# Patient Record
Sex: Male | Born: 1985
Health system: Southern US, Community
[De-identification: ages and names within clinical notes are randomized; demographics above are authoritative.]

## PROBLEM LIST (undated history)

## (undated) DIAGNOSIS — F329 Major depressive disorder, single episode, unspecified: Secondary | ICD-10-CM

## (undated) DIAGNOSIS — F909 Attention-deficit hyperactivity disorder, unspecified type: Secondary | ICD-10-CM

## (undated) DIAGNOSIS — F32A Depression, unspecified: Secondary | ICD-10-CM

## (undated) HISTORY — DX: Major depressive disorder, single episode, unspecified: F32.9

## (undated) HISTORY — DX: Depression, unspecified: F32.A

## (undated) HISTORY — DX: Attention-deficit hyperactivity disorder, unspecified type: F90.9

---

## 2016-02-08 DIAGNOSIS — F411 Generalized anxiety disorder: Secondary | ICD-10-CM | POA: Diagnosis not present

## 2016-02-08 DIAGNOSIS — F909 Attention-deficit hyperactivity disorder, unspecified type: Secondary | ICD-10-CM | POA: Diagnosis not present

## 2016-04-26 ENCOUNTER — Encounter: Payer: Self-pay | Admitting: Family Medicine

## 2016-04-26 ENCOUNTER — Ambulatory Visit (INDEPENDENT_AMBULATORY_CARE_PROVIDER_SITE_OTHER): Payer: BLUE CROSS/BLUE SHIELD | Admitting: Family Medicine

## 2016-04-26 VITALS — BP 116/80 | HR 70 | Temp 97.6°F | Ht 70.0 in | Wt 173.1 lb

## 2016-04-26 DIAGNOSIS — M25572 Pain in left ankle and joints of left foot: Secondary | ICD-10-CM

## 2016-04-26 DIAGNOSIS — Z0000 Encounter for general adult medical examination without abnormal findings: Secondary | ICD-10-CM | POA: Diagnosis not present

## 2016-04-26 LAB — BASIC METABOLIC PANEL
BUN: 14 mg/dL (ref 6–23)
CHLORIDE: 102 meq/L (ref 96–112)
CO2: 30 mEq/L (ref 19–32)
CREATININE: 1.17 mg/dL (ref 0.40–1.50)
Calcium: 10.2 mg/dL (ref 8.4–10.5)
GFR: 79.09 mL/min (ref >60.00)
Glucose, Bld: 98 mg/dL (ref 70–99)
Potassium: 4.6 mEq/L (ref 3.5–5.1)
Sodium: 137 mEq/L (ref 135–145)

## 2016-04-26 LAB — LIPID PANEL
CHOL/HDL RATIO: 5
CHOLESTEROL: 200 mg/dL (ref 0–200)
HDL: 38.5 mg/dL — ABNORMAL LOW (ref >39.00)
LDL Cholesterol: 141 mg/dL — ABNORMAL HIGH (ref 0–99)
NonHDL: 161.41
TRIGLYCERIDES: 104 mg/dL (ref 0.0–149.0)
VLDL: 20.8 mg/dL (ref 0.0–40.0)

## 2016-04-26 NOTE — Patient Instructions (Addendum)
A few things to remember from today's visit:   Routine physical examination - Plan: Lipid Panel, Basic Metabolic Panel  Ankle pain, left - Plan: Ambulatory referral to Sports Medicine    At least 150 minutes of moderate exercise per week, daily brisk walking for 15-30 min is a good exercise option. Healthy diet low in saturated (animal) fats and sweets and consisting of fresh fruits and vegetables, lean meats such as fish and white chicken and whole grains.  - Vaccines:  Tdap vaccine every 10 years.  Shingles vaccine recommended at age 28, could be given after 28 years of age but not sure about insurance coverage.  Pneumonia vaccines:  Prevnar 13 at 28 and Pneumovax at 28.   -Screening recommendations for low/normal risk males:  Screening for diabetes at age 28-45 and every 3 years.    Lipid screening at 28 and every 3 years.   Colonoscopy for colon cancer screening at age 28 and until age 47.  Prostate cancer screening: some controversy.    We have ordered labs or studies at this visit.  It can take up to 1-2 weeks for results and processing. IF results require follow up or explanation, we will call you with instructions. Clinically stable results will be released to your [facility]. If you have not heard from [place] or cannot find your results in [facility] in 2 weeks please contact our office at [phone].  If you are not yet signed up for [facility], please consider signing up       Please be sure medication list is accurate. If a new problem present, please set up appointment sooner than planned today.

## 2016-04-26 NOTE — Progress Notes (Signed)
HPI:  [name] is a 30 y.o.male here today to establish care with me and for his routine physical examination.  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  -Concerns and/or follow up today: Left ankle pain. He is also concerned about diabetes and hyperlipidemia, according to patient in the past his BS and cholesterol have been mildly elevated.  Last CPE 1-2 years ago.  He follows a healthy diet and does exercise regularly.   Chronic medical problems: ADHD, he follows with psychiatrist. He has been Dx with depression in the past, age 21, he denies any symptom.  Hx of STD's : NG in early 20's.    He lives with girlfriend and roommate.    - He denies high alcohol intake or tobacco use. + Smokes marijuana occasional.  Ankle: left ankle pain for a while but worse for the past 9 months. He does not remember a specific injury but he played football during college and states that he may have injured it a few times. Anterior aspect of ankle, occasional numbness dorsum and decreased ROM. He has not noted edema or erythema and no other joint pain.    Review of Systems  Constitutional: Negative for activity change, appetite change, fatigue, fever and unexpected weight change.  HENT: Negative for dental problem, nosebleeds, sore throat, trouble swallowing and voice change.   Eyes: Negative for redness and visual disturbance.  Respiratory: Negative for apnea, cough, shortness of breath and wheezing.   Cardiovascular: Negative for chest pain, palpitations and leg swelling.  Gastrointestinal: Negative for abdominal pain, blood in stool, nausea and vomiting.  Endocrine: Negative for polydipsia and polyuria.  Genitourinary: Negative for decreased urine volume, dysuria, genital sores, hematuria and testicular pain.  Musculoskeletal: Positive for arthralgias. Negative for back pain, joint swelling and myalgias.  Skin: Negative for color change and rash.  Neurological: Positive for  numbness. Negative for dizziness, seizures, syncope, weakness and headaches.  Hematological: Negative for adenopathy. Does not bruise/bleed easily.  Psychiatric/Behavioral: Negative for confusion, hallucinations and sleep disturbance. The patient is not nervous/anxious.      No current outpatient prescriptions on file prior to visit.   No current facility-administered medications on file prior to visit.      Past Medical History:  Diagnosis Date  . ADHD (attention deficit hyperactivity disorder)    Follows with psychiatrists  . Depression    at age 70.    Not on File  Family History  Problem Relation Age of Onset  . Diabetes Father     Social History   Social History  . Marital status: Single    Spouse name: N/A  . Number of children: N/A  . Years of education: N/A   Social History Main Topics  . Smoking status: Never Smoker  . Smokeless tobacco: Never Used  . Alcohol use Yes     Comment: occasional  . Drug use:     Types: Marijuana     Comment: occasional  . Sexual activity: Yes    Birth control/ protection: Condom   Other Topics Concern  . None   Social History Narrative  . None     Vitals:   04/26/16 0801  BP: 116/80  Pulse: 70  Temp: 97.6 F (36.4 C)   Body mass index is 24.84 kg/m.  O2 98% RA.  Wt Readings from Last 3 Encounters:  04/26/16 173 lb 2 oz (78.5 kg)     Physical Exam  Nursing note and vitals reviewed. Constitutional: He  is oriented to person, place, and time. He appears well-developed. No distress.  HENT:  Head: Atraumatic.  Mouth/Throat: Uvula is midline, oropharynx is clear and moist and mucous membranes are normal.  Eyes: Conjunctivae and EOM are normal. Pupils are equal, round, and reactive to light.  Neck: Normal range of motion. No thyromegaly present.  Cardiovascular: Normal rate and regular rhythm.   No murmur heard. Pulses:      Dorsalis pedis pulses are 2+ on the right side, and 2+ on the left side.        Posterior tibial pulses are 2+ on the right side, and 2+ on the left side.  Respiratory: Effort normal and breath sounds normal. No respiratory distress.  GI: Soft. He exhibits no mass. There is no tenderness. Hernia confirmed negative in the right inguinal area and confirmed negative in the left inguinal area.  Genitourinary: Penis normal. Right testis shows no tenderness. Left testis shows no tenderness. Mass: cystic.  Genitourinary Comments: Soft rounded nodular lesion above left testicle.  Musculoskeletal: He exhibits no edema or tenderness.       Left ankle: He exhibits normal range of motion and no swelling. No tenderness. Achilles tendon exhibits no pain.  No major deformities appreciated and no signs of synovitis.   Lymphadenopathy:    He has no cervical adenopathy.       Right: No inguinal and no supraclavicular adenopathy present.       Left: No inguinal and no supraclavicular adenopathy present.  Neurological: He is alert and oriented to person, place, and time. He has normal strength. No cranial nerve deficit or sensory deficit. Coordination and gait normal.  Reflex Scores:      Bicep reflexes are 2+ on the right side and 2+ on the left side.      Patellar reflexes are 2+ on the right side and 2+ on the left side. Skin: Skin is warm. No erythema.  Psychiatric: He has a normal mood and affect.     Lab Results  Component Value Date   CHOL 200 04/26/2016   HDL 38.50 (L) 04/26/2016   LDLCALC 141 (H) 04/26/2016   TRIG 104.0 04/26/2016   CHOLHDL 5 04/26/2016   Lab Results  Component Value Date   CREATININE 1.17 04/26/2016   BUN 14 04/26/2016   NA 137 04/26/2016   K 4.6 04/26/2016   CL 102 04/26/2016   CO2 30 04/26/2016      ASSESSMENT AND PLAN:   Discussed the following assessment and plan:  Diagnoses linked to this encounter. Problem List Items Addressed This Visit    None    Visit Diagnoses    Routine physical examination    -  Primary   Relevant Orders    Lipid Panel (Completed)   Basic Metabolic Panel (Completed)   Ankle pain, left       Relevant Orders   Ambulatory referral to Sports Medicine      Current preventive preventive guidelines discussed. Because reported history of elevated glucose and cholesterol, labs ordered today. STD prevention discussed. Discussed the importance of a healthy diet and regular exercise for prevention of some chronic medical conditions.  Next routine physical in 1-2 years.   -In regard to findings on left testicle, he is reporting history of "cyst", reporting negative workup, unchanged. Recommend monitoring periodically for changes.  -Left ankle pain seems to be chronic, ? tarsal tunnel syndrome.  Referral to sports medicine was placed.    Return in about 1 year (around 04/26/2017)  for CPE.    Maximina Pirozzi G. [place], MD  [facility]. [place] office.

## 2016-04-26 NOTE — Progress Notes (Signed)
Pre visit review using our clinic review tool, if applicable. No additional management support is needed unless otherwise documented below in the visit note. 

## 2016-05-07 ENCOUNTER — Ambulatory Visit: Payer: BLUE CROSS/BLUE SHIELD | Admitting: Family Medicine

## 2016-05-13 NOTE — Progress Notes (Signed)
Tawana ScaleZach Raylee Adamec D.O. [facility] [address] [place], [place] [address] Phone: [phone]) [phone] Subjective:    I'm seeing this patient by the request  of:  Betty [place]azil[place]Jordan, MD   CC: Left ankle pain  GNF:[id number]:Subjective  Michael Vance is a 30 y.o. male coming in with complaint of left ankle pain. Been going on for greater than 9 months. Does not remember any specific injury. Has had numerous injuries from playing football in high school and college.  Patient states though that unfortunately continues to have pain in his left ankle. Notices any centering decreased range of motion. Patient states that it seems to walk. Can't do anything more than some light exercises. Running give him trouble. Patient states that he discuss like he does not have any range of motion. Sometimes associated with swelling. Rates the severity of pain is 4 out of 10 it is more the lack of motion.    Past Medical History:  Diagnosis Date  . ADHD (attention deficit hyperactivity disorder)    Follows with psychiatrists  . Depression    at age 30.   No past surgical history on file. Social History   Social History  . Marital status: Single    Spouse name: N/A  . Number of children: N/A  . Years of education: N/A   Social History Main Topics  . Smoking status: Never Smoker  . Smokeless tobacco: Never Used  . Alcohol use Yes     Comment: occasional  . Drug use:     Types: Marijuana     Comment: occasional  . Sexual activity: Yes    Birth control/ protection: Condom   Other Topics Concern  . None   Social History Narrative  . None   Not on File Family History  Problem Relation Age of Onset  . Diabetes Father     Past medical history, social, surgical and family history all reviewed in electronic medical record.  No pertanent information unless stated regarding to the chief complaint.   Review of Systems: No headache, visual changes, nausea, vomiting, diarrhea, constipation, dizziness, abdominal  pain, skin rash, fevers, chills, night sweats, weight loss, swollen lymph nodes, body aches, joint swelling, muscle aches, chest pain, shortness of breath, mood changes.   Objective  Blood pressure 126/84, pulse 86, height 5\' 10"  (1.778 m), weight 175 lb (79.4 kg), SpO2 98 %.  General: No apparent distress alert and oriented x3 mood and affect normal, dressed appropriately.  HEENT: Pupils equal, extraocular movements intact  Respiratory: Patient's speak in full sentences and does not appear short of breath  Cardiovascular: No lower extremity edema, non tender, no erythema  Skin: Warm dry intact with no signs of infection or rash on extremities or on axial skeleton.  Abdomen: Soft nontender  Neuro: Cranial nerves II through XII are intact, neurovascularly intact in all extremities with 2+ DTRs and 2+ pulses.  Lymph: No lymphadenopathy of posterior or anterior cervical chain or axillae bilaterally.  Gait normal with good balance and coordination.  MSK:  Non tender with full range of motion and good stability and symmetric strength and tone of shoulders, elbows, wrist, hip, knees bilaterally.  Ankle: Left Trace swelling laterally. Range of motion is decreased Lykens the last 7 of dorsiflexion in the last 5 of plantarflexion. Strength is 5/5 in all directions. Stable lateral and medial ligaments; squeeze test and kleiger test unremarkable; Talar dome tender to palpation No pain at base of 5th MT; No tenderness over cuboid; No  tenderness over N spot or navicular prominence No tenderness on posterior aspects of lateral and medial malleolus No sign of peroneal tendon subluxations or tenderness to palpation Negative tarsal tunnel tinel's Able to walk 4 steps. Contralateral ankle unremarkable  MSK US performed of: Left ankle This study was ordered, performed, and interpreted by Terrilee FilesZach Breda Bond D.O.  Foot/Ankle:   All structures visualized.   Talar dome has hypoechoic changes and doesn't appear  with seems to be loose bodies Ankle mortise with mild to moderate effusion Peroneus longus and brevis tendons unremarkable on long and transverse views without sheath effusions. Posterior tibialis, flexor hallucis longus, and flexor digitorum longus tendons unremarkable on long and transverse views without sheath effusions. AAnterior Talofibular Ligament shows chronic tear.  Calcaneofibular Ligaments unremarkable and intact. Deltoid Ligament unremarkable and intact. Increasing Doppler flow noted over the talar dome  IMPRESSION:  Joint effusion with likely loose bodies.     Impression and Recommendations:     This case required medical decision making of moderate complexity.      Note: This dictation was prepared with Dragon dictation along with smaller phrase technology. Any transcriptional errors that result from this process are unintentional.

## 2016-05-14 ENCOUNTER — Ambulatory Visit (INDEPENDENT_AMBULATORY_CARE_PROVIDER_SITE_OTHER): Payer: BLUE CROSS/BLUE SHIELD | Admitting: Family Medicine

## 2016-05-14 ENCOUNTER — Encounter: Payer: Self-pay | Admitting: Family Medicine

## 2016-05-14 ENCOUNTER — Ambulatory Visit (INDEPENDENT_AMBULATORY_CARE_PROVIDER_SITE_OTHER)
Admission: RE | Admit: 2016-05-14 | Discharge: 2016-05-14 | Disposition: A | Discharge status: Home / Self Care | Payer: BLUE CROSS/BLUE SHIELD | Source: Ambulatory Visit | Attending: Family Medicine | Admitting: Family Medicine

## 2016-05-14 ENCOUNTER — Other Ambulatory Visit: Payer: Self-pay

## 2016-05-14 VITALS — BP 126/84 | HR 86 | Ht 70.0 in | Wt 175.0 lb

## 2016-05-14 DIAGNOSIS — M25572 Pain in left ankle and joints of left foot: Secondary | ICD-10-CM | POA: Diagnosis not present

## 2016-05-14 DIAGNOSIS — R29898 Other symptoms and signs involving the musculoskeletal system: Secondary | ICD-10-CM | POA: Diagnosis not present

## 2016-05-14 DIAGNOSIS — M25472 Effusion, left ankle: Secondary | ICD-10-CM

## 2016-05-14 DIAGNOSIS — M25673 Stiffness of unspecified ankle, not elsewhere classified: Secondary | ICD-10-CM | POA: Insufficient documentation

## 2016-05-14 MED ORDER — VITAMIN D (ERGOCALCIFEROL) 1.25 MG (50000 UNIT) PO CAPS: 50000.0000 [IU] | ORAL_CAPSULE | ORAL | 0 refills | Status: DC

## 2016-05-14 MED ORDER — DICLOFENAC SODIUM 2 % TD SOLN: 2.0000 "application " | Freq: Two times a day (BID) | TRANSDERMAL | 3 refills | Status: AC

## 2016-05-14 NOTE — Assessment & Plan Note (Signed)
Patient does have an effusion noted on ultrasound today. Seems to have more of a loose body. I'm concern for possible OCD. X-rays ordered today for further evaluation. We discussed icing regimen. Given once weekly vitamin D supplementation to see if this will help with any type of abnormal fracture that could not be healing. I do think that if patient continues to have limited range of motion that advance imaging would likely be warranted. Due to patient's age he may be a candidate for surgical intervention. Patient though will try conservative therapy, topical anti-inflammatories, and icing. Follow-up again in 3-4 weeks.

## 2016-05-14 NOTE — Patient Instructions (Signed)
Good to see you  I need an xray downstairs Ice bath 10 minutes at night Biking and elliptical is ok but wear the brace.  Wear brace with a lot of walking [employer] orthotics "total support" online would be great  pennsaid pinkie amount topically 2 times daily as needed.  Exercises 3 times a week.  Once weekly vitamin D for next 12 weeks See me again in 3-4 weeks and we will see how you are doing.

## 2016-05-15 ENCOUNTER — Telehealth: Payer: Self-pay | Admitting: Family Medicine

## 2016-05-15 DIAGNOSIS — M25572 Pain in left ankle and joints of left foot: Secondary | ICD-10-CM

## 2016-05-15 NOTE — Telephone Encounter (Signed)
Patient states to go ahead with MRI order.

## 2016-05-15 NOTE — Telephone Encounter (Signed)
Order entered. [facility] will contact pt to schedule appt.

## 2016-05-26 ENCOUNTER — Ambulatory Visit
Admission: RE | Admit: 2016-05-26 | Discharge: 2016-05-26 | Disposition: A | Discharge status: Home / Self Care | Payer: BLUE CROSS/BLUE SHIELD | Source: Ambulatory Visit | Attending: Family Medicine | Admitting: Family Medicine

## 2016-05-26 DIAGNOSIS — M25472 Effusion, left ankle: Secondary | ICD-10-CM | POA: Diagnosis not present

## 2016-05-26 DIAGNOSIS — M25572 Pain in left ankle and joints of left foot: Secondary | ICD-10-CM

## 2016-06-09 NOTE — Progress Notes (Signed)
Tawana ScaleZach Rahma Meller D.O. [facility] [address] [place], [place] [address] Phone: [phone] Subjective:    I'm seeing this patient by the request  of:  Betty [place], MD   CC: Left ankle pain f/u  [id number]:Subjective  Michael Vance is a 30 y.o. male coming in with complaint of left ankle pain. Been going on for greater than 9 months.  Patient's on ultrasound was found to have effusion with potential loose bodies. X-rays confirm this.  Patient was having some worsening symptoms and an MRI was scheduled. 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 Patient states though that he has been doing the icing and home exercises on a much more regular basis. Using the over-the-counter orthotics and has noticed some improvement. Patient states it does not feel as tight as usual. patient does not do any running he seems to be able to do all daily activities without any significant discomfort which is an improvement. Patient wanted no what other options were available to him and is here to discuss with the next.       Past Medical History:  Diagnosis Date  . ADHD (attention deficit hyperactivity disorder)    Follows with psychiatrists  . Depression    at age 89.   No past surgical history on file. Social History   Social History  . Marital status: Single    Spouse name: N/A  . Number of children: N/A  . Years of education: N/A   Social History Main Topics  . Smoking status: Never Smoker  . Smokeless tobacco: Never Used  . Alcohol use Yes     Comment: occasional  . Drug use:     Types: Marijuana     Comment: occasional  . Sexual activity: Yes    Birth control/ protection: Condom   Other Topics Concern  . Not on file   Social History Narrative  . No narrative on file   Not on File Family History  Problem Relation Age of Onset  . Diabetes Father     Past medical history, social, surgical and family history  all reviewed in electronic medical record.  No pertanent information unless stated regarding to the chief complaint.   Review of Systems: No headache, visual changes, nausea, vomiting, diarrhea, constipation, dizziness, abdominal pain, skin rash, fevers, chills, night sweats, weight loss, swollen lymph nodes, body aches, joint swelling, muscle aches, chest pain, shortness of breath, mood changes.   Objective  There were no vitals taken for this visit.  General: No apparent distress alert and oriented x3 mood and affect normal, dressed appropriately.  HEENT: Pupils equal, extraocular movements intact  Respiratory: Patient's speak in full sentences and does not appear short of breath  Cardiovascular: No lower extremity edema, non tender, no erythema  Skin: Warm dry intact with no signs of infection or rash on extremities or on axial skeleton.  Abdomen: Soft nontender  Neuro: Cranial nerves II through XII are intact, neurovascularly intact in all extremities with 2+ DTRs and 2+ pulses.  Lymph: No lymphadenopathy of posterior or anterior cervical chain or axillae bilaterally.  Gait normal with good balance and coordination.  MSK:  Non tender with full range of motion and good stability and symmetric strength and tone of shoulders, elbows, wrist, hip, knees bilaterally.  No swelling noted today of the left ankle. Continues to lack 5-10 of dorsiflexion and 5 of plantarflexion. Strength is 5/5 in all directions. Stable lateral and medial  ligaments; squeeze test and kleiger test unremarkable; Talar dome tender to palpation No pain at base of 5th MT; No tenderness over cuboid; No tenderness over N spot or navicular prominence No tenderness on posterior aspects of lateral and medial malleolus No sign of peroneal tendon subluxations or tenderness to palpation Negative tarsal tunnel tinel's Able to walk 4 steps. Contralateral ankle unremarkable Improvement from previous exam.    Impression and  Recommendations:     This case required medical decision making of moderate complexity.      Note: This dictation was prepared with Dragon dictation along with smaller phrase technology. Any transcriptional errors that result from this process are unintentional.

## 2016-06-10 ENCOUNTER — Encounter: Payer: Self-pay | Admitting: Family Medicine

## 2016-06-10 ENCOUNTER — Ambulatory Visit (INDEPENDENT_AMBULATORY_CARE_PROVIDER_SITE_OTHER): Payer: BLUE CROSS/BLUE SHIELD | Admitting: Family Medicine

## 2016-06-10 DIAGNOSIS — R29898 Other symptoms and signs involving the musculoskeletal system: Secondary | ICD-10-CM

## 2016-06-10 DIAGNOSIS — M25673 Stiffness of unspecified ankle, not elsewhere classified: Secondary | ICD-10-CM

## 2016-06-10 NOTE — Patient Instructions (Signed)
Good to see you   

## 2016-06-10 NOTE — Assessment & Plan Note (Signed)
Discussed with patient at great length. 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 Has been responding somewhat to conservative therapy. We discussed that with the pathology at this moment he would not be able to likely run on a regular basis at any point. Patient once again continue with the conservative therapy. We discussed continuing the icing, home exercises, topical anti-inflammatories. We discussed which activities to avoid. Patient also encouraged to get better shoes. Patient is doing well he can follow-up with me on an as-needed basis. Worsening symptoms we discussed the possibility of steroid injections as well as the potential surgical intervention and patient will be referred if he would like. Patient wants to hold at this time.  Spent  25 minutes with patient face-to-face and had greater than 50% of counseling including as described above in assessment and plan. This included different treatment options management and medications.

## 2016-06-11 DIAGNOSIS — F411 Generalized anxiety disorder: Secondary | ICD-10-CM | POA: Diagnosis not present

## 2016-06-11 DIAGNOSIS — F909 Attention-deficit hyperactivity disorder, unspecified type: Secondary | ICD-10-CM | POA: Diagnosis not present

## 2016-11-07 DIAGNOSIS — F411 Generalized anxiety disorder: Secondary | ICD-10-CM | POA: Diagnosis not present

## 2016-11-07 DIAGNOSIS — F909 Attention-deficit hyperactivity disorder, unspecified type: Secondary | ICD-10-CM | POA: Diagnosis not present

## 2017-03-05 DIAGNOSIS — F411 Generalized anxiety disorder: Secondary | ICD-10-CM | POA: Diagnosis not present

## 2017-03-05 DIAGNOSIS — F909 Attention-deficit hyperactivity disorder, unspecified type: Secondary | ICD-10-CM | POA: Diagnosis not present

## 2017-03-09 IMAGING — DX DG ANKLE COMPLETE 3+V*L*
3 series · 3 of 3 positions shown · non-contrast
Comparison: None.

CLINICAL DATA: One year of ankle pain with no known trauma.

EXAM:
LEFT ANKLE COMPLETE - 3+ VIEW

[ankle ap]
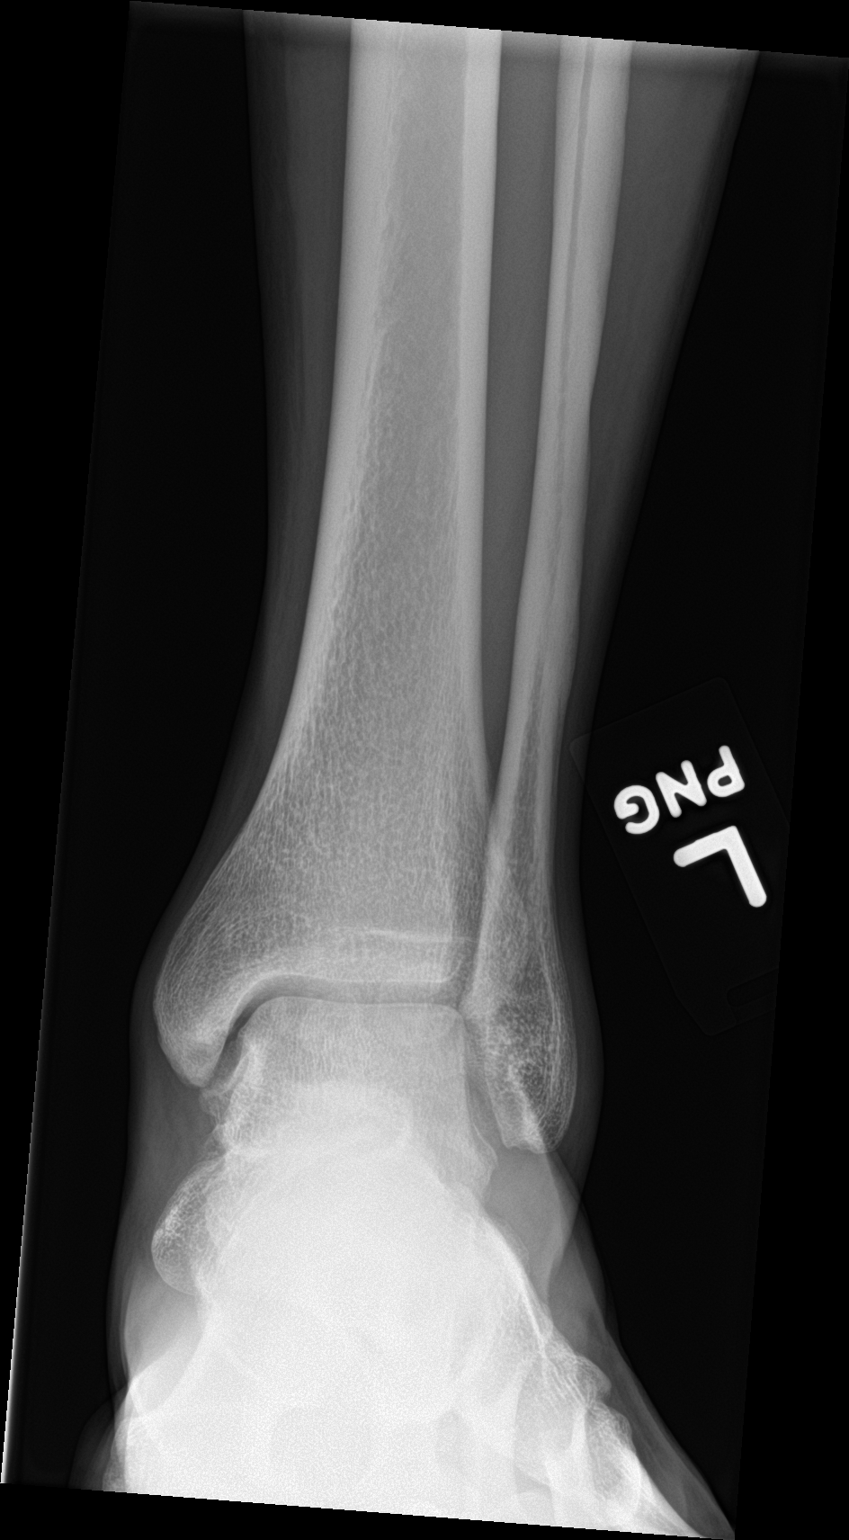

[ankle obl]
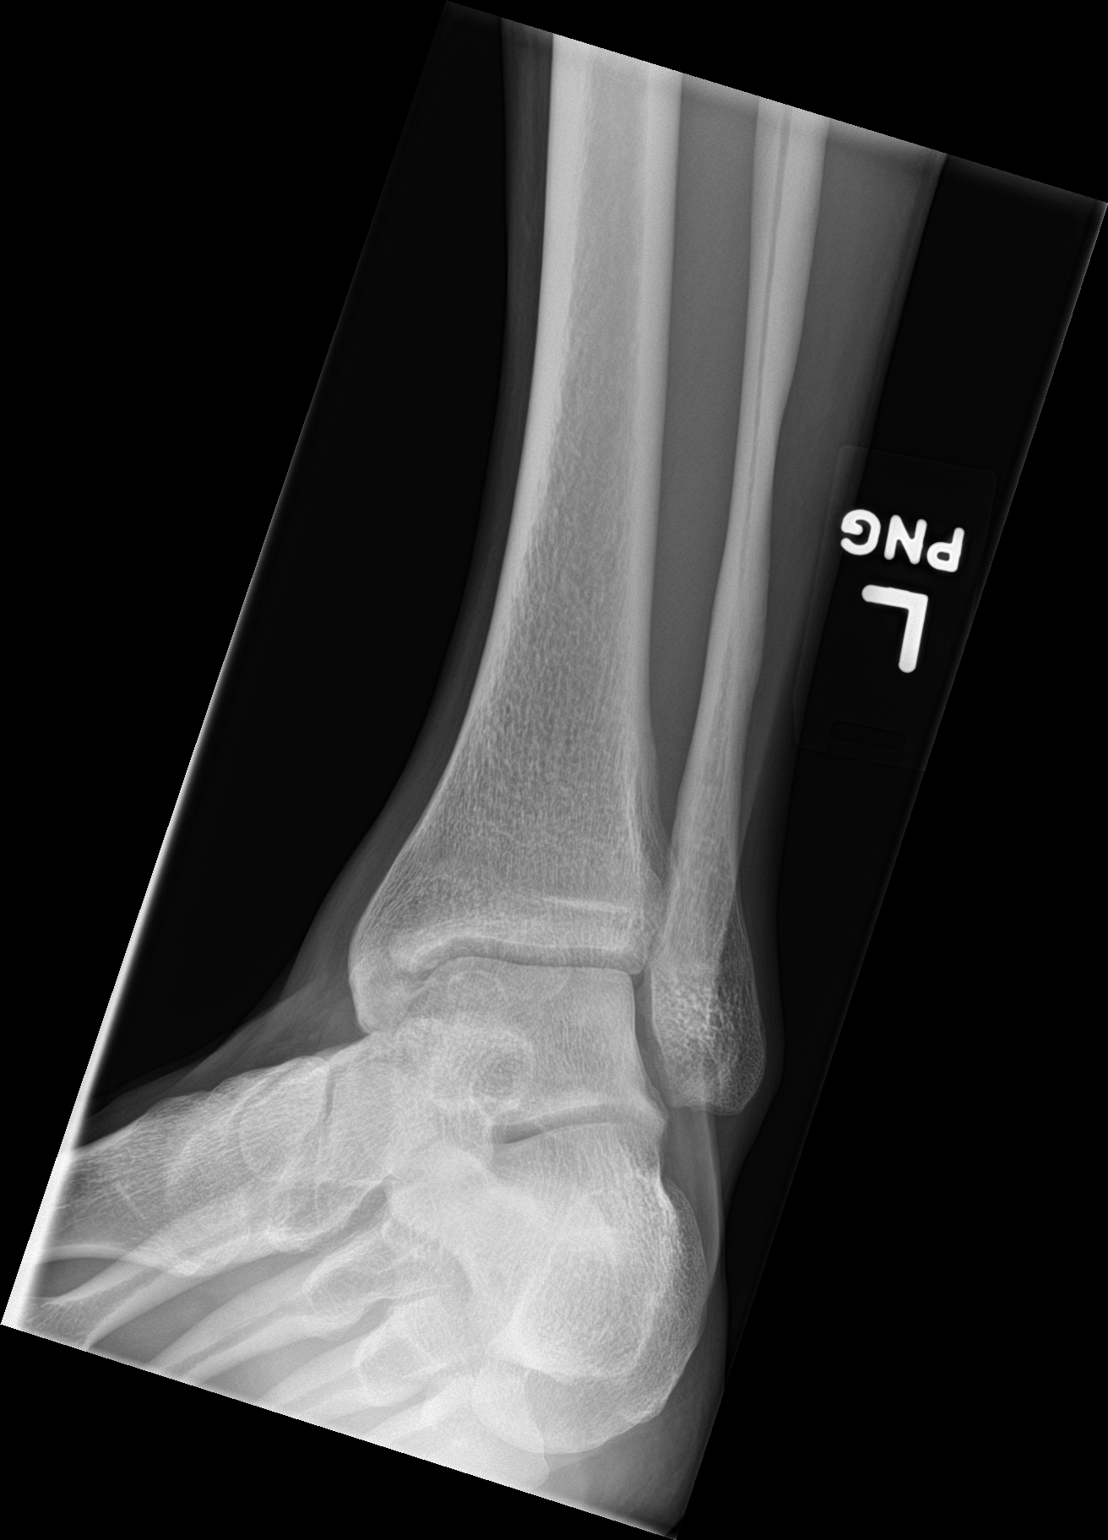

[ankle lat]
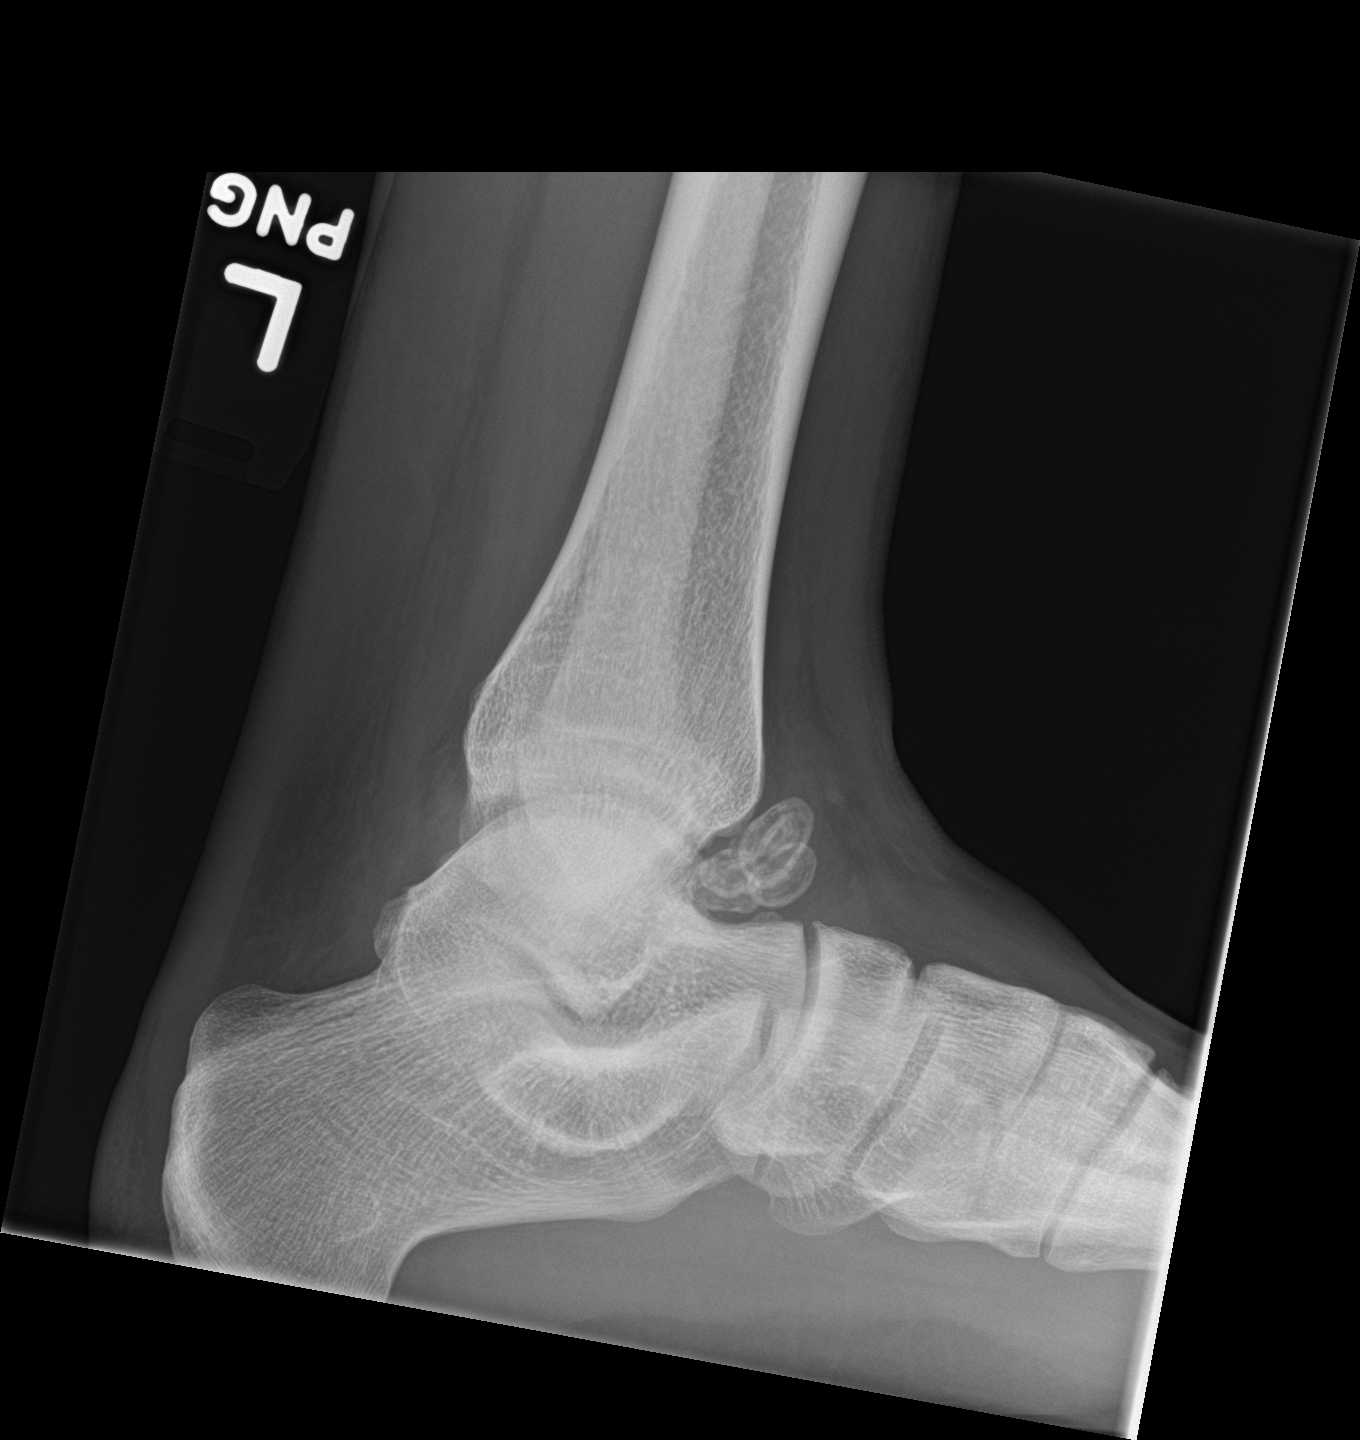

[3 of 3 positions shown; findings below may reference images not displayed]

FINDINGS: Laminated calcifications are seen anteriorly in the ankle joint on
the lateral view. No soft tissue swelling is identified. No acute
fractures are noted.
IMPRESSION: No acute fracture or dislocation. Laminated calcifications in the
anterior ankle joint, synovial osteochondromatosis versus loose
bodies.

## 2017-03-11 DIAGNOSIS — E785 Hyperlipidemia, unspecified: Secondary | ICD-10-CM | POA: Insufficient documentation

## 2017-03-11 NOTE — Progress Notes (Signed)
HPI:  [name] is a 31 y.o.male here today for his routine physical examination. He also needs biometric screening done, brought form today.   He lives with his fiance, planning on getting marry in a year.  Regular exercise 3 or more times per week: More consistent recently. Following a healthy diet: Not consistently.   Chronic medical problems: Chronic left ankle pain, follows with Dr Katrinka Blazing .  Vit D deficiency, he is not on vit D supplementation now. ADHD and past Hx of depression.He is currently following with psychiatrists, he follows every 3 months.  Dyslipidemia: He is on non pharmacologic treatment.  Lab Results  Component Value Date   CHOL 200 04/26/2016   HDL 38.50 (L) 04/26/2016   LDLCALC 141 (H) 04/26/2016   TRIG 104.0 04/26/2016   CHOLHDL 5 04/26/2016    Hx of STD's: GC in early 20's. He would like STD check, denies symptoms. According to pt, his fiance was recently Dx with UTI and she asked him to request urine test. He denies risk factors.  -Denies high alcohol intake, tobacco use, or Hx of illicit drug use.   Review of Systems  Constitutional: Negative for activity change, appetite change, fatigue, fever and unexpected weight change.  HENT: Negative for dental problem, nosebleeds, sore throat, trouble swallowing and voice change.   Eyes: Negative for redness and visual disturbance.  Respiratory: Negative for apnea, cough, shortness of breath and wheezing.   Cardiovascular: Negative for chest pain, palpitations and leg swelling.  Gastrointestinal: Negative for abdominal pain, blood in stool, nausea and vomiting.  Endocrine: Negative for cold intolerance, heat intolerance, polydipsia, polyphagia and polyuria.  Genitourinary: Negative for decreased urine volume, dysuria, genital sores, hematuria and testicular pain.  Musculoskeletal: Negative for gait problem and myalgias.  Skin: Negative for rash.  Neurological: Negative for dizziness,  syncope, weakness and headaches.  Hematological: Negative for adenopathy. Does not bruise/bleed easily.  Psychiatric/Behavioral: Negative for confusion and sleep disturbance. The patient is not nervous/anxious.   All other systems reviewed and are negative.    Current Outpatient Prescriptions on File Prior to Visit  Medication Sig Dispense Refill  . amphetamine-dextroamphetamine (ADDERALL) 30 MG tablet Take 30 mg by mouth daily.    . Diclofenac Sodium (PENNSAID) 2 % SOLN Place 2 application onto the skin 2 (two) times daily. 112 g 3   No current facility-administered medications on file prior to visit.      Past Medical History:  Diagnosis Date  . ADHD (attention deficit hyperactivity disorder)    Follows with psychiatrists  . Depression    at age 75.    Not on File  Family History  Problem Relation Age of Onset  . Diabetes Father     Social History   Social History  . Marital status: Single    Spouse name: N/A  . Number of children: N/A  . Years of education: N/A   Social History Main Topics  . Smoking status: Never Smoker  . Smokeless tobacco: Never Used  . Alcohol use Yes     Comment: occasional  . Drug use: Yes    Types: Marijuana     Comment: occasional  . Sexual activity: Yes    Birth control/ protection: Condom   Other Topics Concern  . None   Social History Narrative  . None     Vitals:   03/12/17 0757  BP: 118/80  Pulse: 80  Resp: 12   Body mass index is 26.02 kg/m.  O2 sat at RA 96%  Wt Readings from Last 3 Encounters:  03/12/17 181 lb 6 oz (82.3 kg)  06/10/16 179 lb (81.2 kg)  05/14/16 175 lb (79.4 kg)     Physical Exam  Nursing note and vitals reviewed. Constitutional: He is oriented to person, place, and time. He appears well-developed and well-nourished. No distress.  HENT:  Head: Atraumatic.  Right Ear: Tympanic membrane, external ear and ear canal normal.  Left Ear: Tympanic membrane, external ear and ear canal normal.    Mouth/Throat: Oropharynx is clear and moist and mucous membranes are normal.  Eyes: Conjunctivae and EOM are normal. Pupils are equal, round, and reactive to light.  Neck: Normal range of motion. No tracheal deviation present. No thyromegaly present.  Cardiovascular: Normal rate and regular rhythm.   No murmur heard. Pulses:      Dorsalis pedis pulses are 2+ on the right side, and 2+ on the left side.  Respiratory: Effort normal and breath sounds normal. No respiratory distress.  GI: Soft. He exhibits no mass. There is no tenderness.  Genitourinary: Testes normal and penis normal.  Genitourinary Comments: Urethral swab collected.  Musculoskeletal: He exhibits no edema or tenderness.  No major deformities appreciated and no signs of synovitis.  Lymphadenopathy:    He has no cervical adenopathy.       Right: No inguinal and no supraclavicular adenopathy present.       Left: No inguinal and no supraclavicular adenopathy present.  Neurological: He is alert and oriented to person, place, and time. He has normal strength. No cranial nerve deficit or sensory deficit. Coordination and gait normal.  Reflex Scores:      Bicep reflexes are 2+ on the right side and 2+ on the left side.      Patellar reflexes are 2+ on the right side and 2+ on the left side. Skin: Skin is warm. No rash noted. No erythema.  Psychiatric: He has a normal mood and affect. Cognition and memory are normal.  Well groomed,good eye contact.     ASSESSMENT AND PLAN:   Harrold Donathathan was seen today for annual exam.  Diagnoses and all orders for this visit:  Lab Results  Component Value Date   CHOL 224 (H) 03/12/2017   HDL 34.00 (L) 03/12/2017   LDLCALC 167 (H) 03/12/2017   TRIG 115.0 03/12/2017   CHOLHDL 7 03/12/2017   Lab Results  Component Value Date   CREATININE 1.12 03/12/2017   BUN 17 03/12/2017   NA 137 03/12/2017   K 4.2 03/12/2017   CL 102 03/12/2017   CO2 29 03/12/2017    Routine general medical  examination at a health care facility   We discussed the importance of regular physical activity and healthy diet for prevention of chronic illness and/or complications. Preventive guidelines reviewed. Vaccination updated. STD prevention discussed. Next CPE in 1-2 years.  -     Lipid panel -     HIV antibody -     Basic metabolic panel  Dyslipidemia (high LDL; low HDL)  Low fat diet recommended. Continue regular exercise. Further recommendations will be given according to lab results.  -     Lipid panel  Diabetes mellitus screening -     Basic metabolic panel  Screen for STD (sexually transmitted disease) -     Chlamydia/Gonococcus/Trichomonas, NAA  Need for Tdap vaccination -     Tdap vaccine greater than or equal to 7yo IM    Return in 1 year (on 03/12/2018)  for routine.    Betty G. [place], MD  [facility]. [place] [facility].

## 2017-03-12 ENCOUNTER — Encounter: Payer: Self-pay | Admitting: Family Medicine

## 2017-03-12 ENCOUNTER — Ambulatory Visit (INDEPENDENT_AMBULATORY_CARE_PROVIDER_SITE_OTHER): Payer: BLUE CROSS/BLUE SHIELD | Admitting: Family Medicine

## 2017-03-12 VITALS — BP 118/80 | HR 80 | Resp 12 | Ht 70.0 in | Wt 181.4 lb

## 2017-03-12 DIAGNOSIS — Z23 Encounter for immunization: Secondary | ICD-10-CM | POA: Diagnosis not present

## 2017-03-12 DIAGNOSIS — E785 Hyperlipidemia, unspecified: Secondary | ICD-10-CM

## 2017-03-12 DIAGNOSIS — Z0000 Encounter for general adult medical examination without abnormal findings: Secondary | ICD-10-CM | POA: Diagnosis not present

## 2017-03-12 DIAGNOSIS — Z131 Encounter for screening for diabetes mellitus: Secondary | ICD-10-CM

## 2017-03-12 DIAGNOSIS — Z113 Encounter for screening for infections with a predominantly sexual mode of transmission: Secondary | ICD-10-CM | POA: Diagnosis not present

## 2017-03-12 LAB — BASIC METABOLIC PANEL
BUN: 17 mg/dL (ref 6–23)
CALCIUM: 9.8 mg/dL (ref 8.4–10.5)
CHLORIDE: 102 meq/L (ref 96–112)
CO2: 29 mEq/L (ref 19–32)
CREATININE: 1.12 mg/dL (ref 0.40–1.50)
GFR: 82.65 mL/min (ref >60.00)
Glucose, Bld: 91 mg/dL (ref 70–99)
Potassium: 4.2 mEq/L (ref 3.5–5.1)
Sodium: 137 mEq/L (ref 135–145)

## 2017-03-12 LAB — LIPID PANEL
CHOL/HDL RATIO: 7
Cholesterol: 224 mg/dL — ABNORMAL HIGH (ref 0–200)
HDL: 34 mg/dL — AB (ref >39.00)
LDL Cholesterol: 167 mg/dL — ABNORMAL HIGH (ref 0–99)
NONHDL: 189.96
Triglycerides: 115 mg/dL (ref 0.0–149.0)
VLDL: 23 mg/dL (ref 0.0–40.0)

## 2017-03-12 NOTE — Patient Instructions (Signed)
A few things to remember from today's visit:   Routine general medical examination at a health care facility - Plan: Lipid panel, HIV antibody, Basic metabolic panel  Dyslipidemia (high LDL; low HDL) - Plan: Lipid panel  Diabetes mellitus screening - Plan: Basic metabolic panel  Screen for STD (sexually transmitted disease) - Plan: Chlamydia/Gonococcus/Trichomonas, NAA    At least 150 minutes of moderate exercise per week, daily brisk walking for 15-30 min is a good exercise option. Healthy diet low in saturated (animal) fats and sweets and consisting of fresh fruits and vegetables, lean meats such as fish and white chicken and whole grains.  - Vaccines:  Tdap vaccine every 10 years. Given today.  Shingles vaccine recommended at age 20, could be given after 29 years of age but not sure about insurance coverage.  Pneumonia vaccines:  Prevnar 13 at 65 and Pneumovax at 28.   -Screening recommendations for low/normal risk males:  Screening for diabetes at age 67-45 and every 3 years.    Lipid screening at 35 and every 3 years but in your case we will continue annually.   Colonoscopy for colon cancer screening at age 41 and until age 53.  Prostate cancer screening: some controversy.   Fat and Cholesterol Restricted Diet High levels of fat and cholesterol in your blood may lead to various health problems, such as diseases of the heart, blood vessels, gallbladder, liver, and pancreas. Fats are concentrated sources of energy that come in various forms. Certain types of fat, including saturated fat, may be harmful in excess. Cholesterol is a substance needed by your body in small amounts. Your body makes all the cholesterol it needs. Excess cholesterol comes from the food you eat. When you have high levels of cholesterol and saturated fat in your blood, health problems can develop because the excess fat and cholesterol will gather along the walls of your blood vessels, causing them to  narrow. Choosing the right foods will help you control your intake of fat and cholesterol. This will help keep the levels of these substances in your blood within normal limits and reduce your risk of disease.  What types of fat should I choose?  Choose healthy fats more often. Choose monounsaturated and polyunsaturated fats, such as olive and canola oil, flaxseeds, walnuts, almonds, and seeds.  Eat more omega-3 fats. Good choices include salmon, mackerel, sardines, tuna, flaxseed oil, and ground flaxseeds. Aim to eat fish at least two times a week.  Limit saturated fats. Saturated fats are primarily found in animal products, such as meats, butter, and cream. Plant sources of saturated fats include palm oil, palm kernel oil, and coconut oil.  Avoid foods with partially hydrogenated oils in them. These contain trans fats. Examples of foods that contain trans fats are stick margarine, some tub margarines, cookies, crackers, and other baked goods. What general guidelines do I need to follow? These guidelines for healthy eating will help you control your intake of fat and cholesterol:  Check food labels carefully to identify foods with trans fats or high amounts of saturated fat.  Fill one half of your plate with vegetables and green salads.  Fill one fourth of your plate with whole grains. Look for the word "whole" as the first word in the ingredient list.  Fill one fourth of your plate with lean protein foods.  Limit fruit to two servings a day. Choose fruit instead of juice.  Eat more foods that contain fiber, such as apples, broccoli, carrots, beans, peas,  and barley.  Eat more home-cooked food and less restaurant, buffet, and fast food.  Limit or avoid alcohol.  Limit foods high in starch and sugar.  Limit fried foods.  Cook foods using methods other than frying. Baking, boiling, grilling, and broiling are all great options.  Lose weight if you are overweight. Losing just 5-10%  of your initial body weight can help your overall health and prevent diseases such as diabetes and heart disease.  What foods can I eat? Grains  Whole grains, such as whole wheat or whole grain breads, crackers, cereals, and pasta. Unsweetened oatmeal, bulgur, barley, quinoa, or brown rice. Corn or whole wheat flour tortillas. Vegetables  Fresh or frozen vegetables (raw, steamed, roasted, or grilled). Green salads. Fruits  All fresh, canned (in natural juice), or frozen fruits. Meats and other protein foods  Ground beef (85% or leaner), grass-fed beef, or beef trimmed of fat. Skinless chicken or [place]. Ground chicken or [place]. Pork trimmed of fat. All fish and seafood. Eggs. Dried beans, peas, or lentils. Unsalted nuts or seeds. Unsalted canned or dry beans. Dairy  Low-fat dairy products, such as skim or 1% milk, 2% or reduced-fat cheeses, low-fat ricotta or cottage cheese, or plain low-fat yo Fats and oils  Tub margarines without trans fats. Light or reduced-fat mayonnaise and salad dressings. Avocado. Olive, canola, sesame, or safflower oils. Natural peanut or almond butter (choose ones without added sugar and oil). The items listed above may not be a complete list of recommended foods or beverages. Contact your dietitian for more options. Foods to avoid Grains  White bread. White pasta. White rice. Cornbread. Bagels, pastries, and croissants. Crackers that contain trans fat. Vegetables  White potatoes. Corn. Creamed or fried vegetables. Vegetables in a cheese sauce. Fruits  Dried fruits. Canned fruit in light or heavy syrup. Fruit juice. Meats and other protein foods  Fatty cuts of meat. Ribs, chicken wings, bacon, sausage, bologna, salami, chitterlings, fatback, hot dogs, bratwurst, and packaged luncheon meats. Liver and organ meats. Dairy  Whole or 2% milk, cream, half-and-half, and cream cheese. Whole milk cheeses. Whole-fat or sweetened yogurt. Full-fat cheeses. Nondairy  creamers and whipped toppings. Processed cheese, cheese spreads, or cheese curds. Beverages  Alcohol. Sweetened drinks (such as sodas, lemonade, and fruit drinks or punches). Fats and oils  Butter, stick margarine, lard, shortening, ghee, or bacon fat. Coconut, palm kernel, or palm oils. Sweets and desserts  Corn syrup, sugars, honey, and molasses. Candy. Jam and jelly. Syrup. Sweetened cereals. Cookies, pies, cakes, donuts, muffins, and ice cream. The items listed above may not be a complete list of foods and beverages to avoid. Contact your dietitian for more information. This information is not intended to replace advice given to you by your health care provider. Make sure you discuss any questions you have with your health care provider. Document Released: 09/16/2005 Document Revised: 10/07/2014 Document Reviewed: 12/15/2013 Elsevier Interactive Patient Education  2017 Elsevier Inc.      Please be sure medication list is accurate. If a new problem present, please set up appointment sooner than planned today.

## 2017-03-13 LAB — HIV ANTIBODY (ROUTINE TESTING W REFLEX): HIV 1&2 Ab, 4th Generation: NONREACTIVE

## 2017-03-15 LAB — CHLAMYDIA/GONOCOCCUS/TRICHOMONAS, NAA
CHLAMYDIA BY NAA: NEGATIVE
GONOCOCCUS BY NAA: NEGATIVE
Trich vag by NAA: NEGATIVE

## 2017-03-16 ENCOUNTER — Encounter: Payer: Self-pay | Admitting: Family Medicine

## 2017-06-19 ENCOUNTER — Encounter: Payer: Self-pay | Admitting: Family Medicine

## 2017-07-15 DIAGNOSIS — F909 Attention-deficit hyperactivity disorder, unspecified type: Secondary | ICD-10-CM | POA: Diagnosis not present

## 2017-07-15 DIAGNOSIS — F411 Generalized anxiety disorder: Secondary | ICD-10-CM | POA: Diagnosis not present

## 2018-04-22 DIAGNOSIS — F909 Attention-deficit hyperactivity disorder, unspecified type: Secondary | ICD-10-CM | POA: Diagnosis not present

## 2018-04-22 DIAGNOSIS — F411 Generalized anxiety disorder: Secondary | ICD-10-CM | POA: Diagnosis not present

## 2018-09-14 ENCOUNTER — Encounter: Payer: BLUE CROSS/BLUE SHIELD | Admitting: Family Medicine

## 2018-09-15 ENCOUNTER — Encounter: Payer: Self-pay | Admitting: Family Medicine

## 2018-09-15 ENCOUNTER — Ambulatory Visit (INDEPENDENT_AMBULATORY_CARE_PROVIDER_SITE_OTHER): Payer: BLUE CROSS/BLUE SHIELD | Admitting: Family Medicine

## 2018-09-15 VITALS — BP 120/70 | HR 66 | Temp 97.8°F | Resp 12 | Ht 70.0 in | Wt 190.4 lb

## 2018-09-15 DIAGNOSIS — E785 Hyperlipidemia, unspecified: Secondary | ICD-10-CM | POA: Diagnosis not present

## 2018-09-15 DIAGNOSIS — Z1329 Encounter for screening for other suspected endocrine disorder: Secondary | ICD-10-CM

## 2018-09-15 DIAGNOSIS — Z13228 Encounter for screening for other metabolic disorders: Secondary | ICD-10-CM

## 2018-09-15 DIAGNOSIS — Z0000 Encounter for general adult medical examination without abnormal findings: Secondary | ICD-10-CM

## 2018-09-15 DIAGNOSIS — Z23 Encounter for immunization: Secondary | ICD-10-CM

## 2018-09-15 DIAGNOSIS — Z130 Encounter for screening for diseases of the blood and blood-forming organs and certain disorders involving the immune mechanism: Secondary | ICD-10-CM | POA: Diagnosis not present

## 2018-09-15 LAB — BASIC METABOLIC PANEL
BUN: 15 mg/dL (ref 6–23)
CALCIUM: 9.7 mg/dL (ref 8.4–10.5)
CO2: 29 meq/L (ref 19–32)
CREATININE: 1.07 mg/dL (ref 0.40–1.50)
Chloride: 104 mEq/L (ref 96–112)
GFR: 86.21 mL/min (ref >60.00)
Glucose, Bld: 114 mg/dL — ABNORMAL HIGH (ref 70–99)
Potassium: 4.8 mEq/L (ref 3.5–5.1)
Sodium: 140 mEq/L (ref 135–145)

## 2018-09-15 LAB — LIPID PANEL
CHOL/HDL RATIO: 7
Cholesterol: 199 mg/dL (ref 0–200)
HDL: 29.4 mg/dL — ABNORMAL LOW (ref >39.00)
LDL Cholesterol: 141 mg/dL — ABNORMAL HIGH (ref 0–99)
NonHDL: 169.38
TRIGLYCERIDES: 140 mg/dL (ref 0.0–149.0)
VLDL: 28 mg/dL (ref 0.0–40.0)

## 2018-09-15 NOTE — Assessment & Plan Note (Signed)
Low-fat diet recommended. Further recommendations will be given according to lipid panel results.

## 2018-09-15 NOTE — Progress Notes (Signed)
HPI:  [name] is a 32 y.o.male here today for his routine physical examination.  Last CPE: 6/13//18 He lives with his wife, he got married earlier this year and his wife is expecting his first child. He is also planning on moving to [place] in 10/2018.  Regular exercise 3 or more times per week: Until about 3 to 4 weeks ago he was exercising 3-4 times per week. Following a healthy diet: He has not been consistent.   Chronic medical problems: ADHD,chronic left ankle pain, and depression,he follows with psychiatrist.  Dyslipidemia,he is on non pharmacologic treatment.  Last FLP in 02/2016: TC 224,TG 115,HDL 34,and LDL 167.   Hx of STD's: Negative.  Immunization History  Administered Date(s) Administered  . Influenza,inj,Quad PF,6+ Mos 09/15/2018  . Tdap 03/12/2017     -Denies high alcohol intake, tobacco use, or Hx of illicit drug use.  No concerns today.   Review of Systems  Constitutional: Negative for activity change, appetite change, fatigue and fever.  HENT: Negative for dental problem, nosebleeds, sore throat, trouble swallowing and voice change.   Eyes: Negative for redness and visual disturbance.  Respiratory: Negative for cough, shortness of breath and wheezing.   Cardiovascular: Negative for chest pain, palpitations and leg swelling.  Gastrointestinal: Negative for abdominal pain, blood in stool, nausea and vomiting.  Endocrine: Negative for cold intolerance, heat intolerance, polydipsia, polyphagia and polyuria.  Genitourinary: Negative for decreased urine volume, dysuria, genital sores, hematuria and testicular pain.  Musculoskeletal: Positive for arthralgias. Negative for gait problem and joint swelling.  Skin: Negative for color change and rash.  Neurological: Negative for syncope, weakness and headaches.  Hematological: Negative for adenopathy. Does not bruise/bleed easily.  Psychiatric/Behavioral: Negative for confusion and sleep  disturbance. The patient is not nervous/anxious.   All other systems reviewed and are negative.    Current Outpatient Medications on File Prior to Visit  Medication Sig Dispense Refill  . amphetamine-dextroamphetamine (ADDERALL) 30 MG tablet Take 30 mg by mouth daily.    . Diclofenac Sodium (PENNSAID) 2 % SOLN Place 2 application onto the skin 2 (two) times daily. 112 g 3   No current facility-administered medications on file prior to visit.      Past Medical History:  Diagnosis Date  . ADHD (attention deficit hyperactivity disorder)    Follows with psychiatrists  . Depression    at age 52.    History reviewed. No pertinent surgical history.  Not on File  Family History  Problem Relation Age of Onset  . Diabetes Father     Social History   Socioeconomic History  . Marital status: Single    Spouse name: Not on file  . Number of children: Not on file  . Years of education: Not on file  . Highest education level: Not on file  Occupational History  . Not on file  Social Needs  . Financial resource strain: Not on file  . Food insecurity:    Worry: Not on file    Inability: Not on file  . Transportation needs:    Medical: Not on file    Non-medical: Not on file  Tobacco Use  . Smoking status: Never Smoker  . Smokeless tobacco: Never Used  Substance and Sexual Activity  . Alcohol use: Yes    Comment: occasional  . Drug use: Yes    Types: Marijuana    Comment: occasional  . Sexual activity: Yes    Birth control/protection: Condom  Lifestyle  .  Physical activity:    Days per week: Not on file    Minutes per session: Not on file  . Stress: Not on file  Relationships  . Social connections:    Talks on phone: Not on file    Gets together: Not on file    Attends religious service: Not on file    Active member of club or organization: Not on file    Attends meetings of clubs or organizations: Not on file    Relationship status: Not on file  Other Topics  Concern  . Not on file  Social History Narrative  . Not on file     Vitals:   09/15/18 0751  BP: 120/70  Pulse: 66  Resp: 12  Temp: 97.8 F (36.6 C)  SpO2: 98%   Body mass index is 27.32 kg/m.   Wt Readings from Last 3 Encounters:  09/15/18 190 lb 6 oz (86.4 kg)  03/12/17 181 lb 6 oz (82.3 kg)  06/10/16 179 lb (81.2 kg)    Physical Exam  Nursing note and vitals reviewed. Constitutional: He is oriented to person, place, and time. He appears well-developed and well-nourished. No distress.  HENT:  Head: Normocephalic and atraumatic.  Right Ear: Tympanic membrane, external ear and ear canal normal.  Left Ear: Tympanic membrane, external ear and ear canal normal.  Mouth/Throat: Oropharynx is clear and moist and mucous membranes are normal.  Eyes: Pupils are equal, round, and reactive to light. Conjunctivae and EOM are normal.  Neck: Normal range of motion. No tracheal deviation present. No thyromegaly present.  Cardiovascular: Normal rate and regular rhythm.  No murmur heard. Pulses:      Dorsalis pedis pulses are 2+ on the right side and 2+ on the left side.  Respiratory: Effort normal and breath sounds normal. No respiratory distress.  GI: Soft. He exhibits no mass. There is no hepatomegaly. There is no abdominal tenderness.  Genitourinary:    Genitourinary Comments: Refused,no concerns.   Musculoskeletal:        General: No tenderness or edema.     Comments: No major deformities appreciated and no signs of synovitis.  Lymphadenopathy:    He has no cervical adenopathy.       Right: No supraclavicular adenopathy present.       Left: No supraclavicular adenopathy present.  Neurological: He is alert and oriented to person, place, and time. He has normal strength. No cranial nerve deficit or sensory deficit. Coordination and gait normal.  Reflex Scores:      Bicep reflexes are 2+ on the right side and 2+ on the left side.      Patellar reflexes are 2+ on the right side  and 2+ on the left side. Skin: Skin is warm. No erythema.  Psychiatric: He has a normal mood and affect. Cognition and memory are normal.  Well groomed,good eye contact.     ASSESSMENT AND PLAN:  [name] was seen today for annual exam.  Orders Placed This Encounter  Procedures  . Flu Vaccine QUAD 36+ mos IM  . Basic metabolic panel  . Lipid panel     Lab Results  Component Value Date   CHOL 199 09/15/2018   HDL 29.40 (L) 09/15/2018   LDLCALC 141 (H) 09/15/2018   TRIG 140.0 09/15/2018   CHOLHDL 7 09/15/2018   Lab Results  Component Value Date   CREATININE 1.07 09/15/2018   BUN 15 09/15/2018   NA 140 09/15/2018   K 4.8 09/15/2018  CL 104 09/15/2018   CO2 29 09/15/2018    Routine general medical examination at a health care facility We discussed the importance of regular physical activity and healthy diet for prevention of chronic illness and/or complications. Preventive guidelines reviewed. Vaccination up-to-date. STD prevention discussed. Next CPE in a year.  Screening for endocrine, metabolic and immunity disorder -     Basic metabolic panel    Dyslipidemia (high LDL; low HDL) Low-fat diet recommended. Further recommendations will be given according to lipid panel results.    Return in 1 year (on 09/16/2019) for cpe,moving to [place].     Michael Rhine G. [place], MD  [facility]. [place] office.

## 2018-09-15 NOTE — Patient Instructions (Addendum)
A few things to remember from today's visit:   Routine general medical examination at a health care facility  Dyslipidemia (high LDL; low HDL) - Plan: Lipid panel  Screening for endocrine, metabolic and immunity disorder - Plan: Basic metabolic panel   At least 150 minutes of moderate exercise per week, daily brisk walking for 15-30 min is a good exercise option. Healthy diet low in saturated (animal) fats and sweets and consisting of fresh fruits and vegetables, lean meats such as fish and white chicken and whole grains.  - Vaccines:  Tdap vaccine every 10 years.  Shingles vaccine recommended at age 30, could be given after 30 years of age but not sure about insurance coverage.  Pneumonia vaccines:  Prevnar 13 at 65 and Pneumovax at 966.   -Screening recommendations for low/normal risk males:  Screening for diabetes every 1-3 years. Earlier screening if cardiovascular risk factors.  Colon cancer screening at age 30 and until age 30.  Prostate cancer screening: some controversy, starts usually at 50: Rectal exam and PSA.  Aortic Abdominal Aneurism once between 5665 and 30 years old if ever smoker.  Also recommended:  1. Dental visit- Brush and floss your teeth twice daily; visit your dentist twice a year. 2. Eye doctor- Get an eye exam at least every 2 years. 3. Helmet use- Always wear a helmet when riding a bicycle, motorcycle, rollerblading or skateboarding. 4. Safe sex- If you may be exposed to sexually transmitted infections, use a condom. 5. Seat belts- Seat belts can save your live; always wear one. 6. Smoke/Carbon Monoxide detectors- These detectors need to be installed on the appropriate level of your home. Replace batteries at least once a year. 7. Skin cancer- When out in the sun please cover up and use sunscreen 15 SPF or higher. 8. Violence- If anyone is threatening or hurting you, please tell your healthcare provider.  9. Drink alcohol in moderation- Limit alcohol  intake to one drink or less per day. Never drink and drive.

## 2018-09-19 ENCOUNTER — Encounter: Payer: Self-pay | Admitting: Family Medicine
